# Patient Record
Sex: Male | Born: 1995 | Race: Black or African American | Hispanic: No | Marital: Married | Smoking: Current every day smoker
Health system: Southern US, Community
[De-identification: ages and names within clinical notes are randomized; demographics above are authoritative.]

## PROBLEM LIST (undated history)

## (undated) DIAGNOSIS — F909 Attention-deficit hyperactivity disorder, unspecified type: Secondary | ICD-10-CM

---
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
## 2014-03-11 ENCOUNTER — Encounter (HOSPITAL_COMMUNITY): Payer: Self-pay | Admitting: Emergency Medicine

## 2014-03-11 ENCOUNTER — Emergency Department (HOSPITAL_COMMUNITY)
Admission: EM | Admit: 2014-03-11 | Discharge: 2014-03-11 | Disposition: A | Discharge status: Home / Self Care | Payer: Medicaid Other | Attending: Emergency Medicine | Admitting: Emergency Medicine

## 2014-03-11 DIAGNOSIS — J029 Acute pharyngitis, unspecified: Secondary | ICD-10-CM | POA: Insufficient documentation

## 2014-03-11 DIAGNOSIS — R5383 Other fatigue: Secondary | ICD-10-CM

## 2014-03-11 DIAGNOSIS — R5381 Other malaise: Secondary | ICD-10-CM | POA: Insufficient documentation

## 2014-03-11 DIAGNOSIS — J3489 Other specified disorders of nose and nasal sinuses: Secondary | ICD-10-CM | POA: Insufficient documentation

## 2014-03-11 DIAGNOSIS — D72829 Elevated white blood cell count, unspecified: Secondary | ICD-10-CM | POA: Insufficient documentation

## 2014-03-11 DIAGNOSIS — R112 Nausea with vomiting, unspecified: Secondary | ICD-10-CM | POA: Insufficient documentation

## 2014-03-11 DIAGNOSIS — F17200 Nicotine dependence, unspecified, uncomplicated: Secondary | ICD-10-CM | POA: Insufficient documentation

## 2014-03-11 DIAGNOSIS — M542 Cervicalgia: Secondary | ICD-10-CM | POA: Insufficient documentation

## 2014-03-11 DIAGNOSIS — F988 Other specified behavioral and emotional disorders with onset usually occurring in childhood and adolescence: Secondary | ICD-10-CM | POA: Insufficient documentation

## 2014-03-11 LAB — CBC WITH DIFFERENTIAL/PLATELET
BASOS ABS: 0 10*3/uL (ref 0.0–0.1)
BASOS PCT: 0 % (ref 0–1)
Eosinophils Absolute: 0 10*3/uL (ref 0.0–0.7)
Eosinophils Relative: 0 % (ref 0–5)
HCT: 41.3 % (ref 39.0–52.0)
Hemoglobin: 14.9 g/dL (ref 13.0–17.0)
LYMPHS PCT: 5 % — AB (ref 12–46)
Lymphs Abs: 1 10*3/uL (ref 0.7–4.0)
MCH: 27.1 pg (ref 26.0–34.0)
MCHC: 36.1 g/dL — AB (ref 30.0–36.0)
MCV: 75.1 fL — ABNORMAL LOW (ref 78.0–100.0)
Monocytes Absolute: 2.3 10*3/uL — ABNORMAL HIGH (ref 0.1–1.0)
Monocytes Relative: 11 % (ref 3–12)
NEUTROS ABS: 17.4 10*3/uL — AB (ref 1.7–7.7)
NEUTROS PCT: 84 % — AB (ref 43–77)
PLATELETS: 296 10*3/uL (ref 150–400)
RBC: 5.5 MIL/uL (ref 4.22–5.81)
RDW: 13.6 % (ref 11.5–15.5)
WBC: 20.7 10*3/uL — AB (ref 4.0–10.5)

## 2014-03-11 LAB — MONONUCLEOSIS SCREEN: MONO SCREEN: NEGATIVE

## 2014-03-11 LAB — COMPREHENSIVE METABOLIC PANEL
ALBUMIN: 3.9 g/dL (ref 3.5–5.2)
ALT: 23 U/L (ref 0–53)
AST: 22 U/L (ref 0–37)
Alkaline Phosphatase: 60 U/L (ref 39–117)
BILIRUBIN TOTAL: 0.5 mg/dL (ref 0.3–1.2)
BUN: 11 mg/dL (ref 6–23)
CHLORIDE: 95 meq/L — AB (ref 96–112)
CO2: 25 meq/L (ref 19–32)
Calcium: 9.5 mg/dL (ref 8.4–10.5)
Creatinine, Ser: 1.03 mg/dL (ref 0.50–1.35)
GFR calc Af Amer: >90 mL/min (ref >90)
Glucose, Bld: 107 mg/dL — ABNORMAL HIGH (ref 70–99)
Potassium: 3.7 mEq/L (ref 3.7–5.3)
SODIUM: 134 meq/L — AB (ref 137–147)
Total Protein: 8.2 g/dL (ref 6.0–8.3)

## 2014-03-11 LAB — LIPASE, BLOOD: Lipase: 16 U/L (ref 11–59)

## 2014-03-11 LAB — RAPID STREP SCREEN (MED CTR MEBANE ONLY): STREPTOCOCCUS, GROUP A SCREEN (DIRECT): NEGATIVE

## 2014-03-11 MED ORDER — IBUPROFEN 800 MG PO TABS: 800.0000 mg | ORAL_TABLET | Freq: Three times a day (TID) | ORAL | Status: DC

## 2014-03-11 MED ORDER — KETOROLAC TROMETHAMINE 30 MG/ML IJ SOLN
30.0000 mg | Freq: Once | INTRAMUSCULAR | Status: AC
  Administered 2014-03-11: 30 mg via INTRAVENOUS
  Filled 2014-03-11: qty 1

## 2014-03-11 MED ORDER — PENICILLIN G BENZATHINE 1200000 UNIT/2ML IM SUSP
1.2000 10*6.[IU] | Freq: Once | INTRAMUSCULAR | Status: AC
  Administered 2014-03-11: 1.2 10*6.[IU] via INTRAMUSCULAR
  Filled 2014-03-11: qty 2

## 2014-03-11 MED ORDER — DEXAMETHASONE SODIUM PHOSPHATE 10 MG/ML IJ SOLN
10.0000 mg | Freq: Once | INTRAMUSCULAR | Status: AC
  Administered 2014-03-11: 10 mg via INTRAVENOUS
  Filled 2014-03-11: qty 1

## 2014-03-11 MED ORDER — SODIUM CHLORIDE 0.9 % IV BOLUS (SEPSIS)
1000.0000 mL | Freq: Once | INTRAVENOUS | Status: AC
  Administered 2014-03-11: 1000 mL via INTRAVENOUS

## 2014-03-11 NOTE — ED Notes (Addendum)
Pt reports N/V x3 days. Reports throat is raw and sore because of it. Visitor is speaking mostly for pt because of throat pain. Pt denies diarrhea. Denies abd pain.Reports he hasn't eaten in 3 days.

## 2014-03-11 NOTE — ED Notes (Signed)
Pt now reports he can't turn his neck to the R due to pain.

## 2014-03-11 NOTE — ED Provider Notes (Signed)
CSN: [id number]     Arrival date & time 03/11/14  0724 History   First MD Initiated Contact with Patient 03/11/14 [phone]     Chief Complaint  Patient presents with  . Sore Throat  . Nausea  . Emesis   HPI  Drew Carney is a 18 y.o. male with a PMH of ADHD who presents to the ED for evaluation of a sore throat, nausea, and vomiting. History was provided by the patient. Patient states that he developed cold symptoms 2 days ago. Started with a sore throat, abdominal pain, nasal congestion, fatigue, generalized weakness, nausea and vomiting. He states his abdominal pain, nausea and vomiting has resolved, however, the rest of his symptoms remain. He denies any sick contacts. He has not taken anything to treat his symptoms. Throat pain is constant, worse with swallowing, and described as a throbbing pain. No voice change or difficulty swallowing. Has had decreased appetite and intake due to pain. He also has now developed neck pain with no neck stiffness. Neck pain worse with movement. He denies any fever, headache, cough, SOB, wheezing, diarrhea, constipation, dysuria, or rash.    Past Medical History  Diagnosis Date  . ADHD (attention deficit hyperactivity disorder)    History reviewed. No pertinent past surgical history. No family history on file. History  Substance Use Topics  . Smoking status: Current Every Day Smoker    Types: Cigarettes, Cigars  . Smokeless tobacco: Not on file  . Alcohol Use: No    Review of Systems  Constitutional: Positive for chills, activity change, appetite change and fatigue. Negative for fever.  HENT: Positive for congestion and sore throat. Negative for ear pain, rhinorrhea, trouble swallowing and voice change.   Eyes: Negative for photophobia and visual disturbance.  Respiratory: Negative for cough and shortness of breath.   Cardiovascular: Negative for chest pain and leg swelling.  Gastrointestinal: Negative for nausea (resolved), vomiting (resolved),  abdominal pain (resolved), diarrhea and constipation.  Genitourinary: Negative for dysuria and difficulty urinating.  Musculoskeletal: Positive for neck pain. Negative for back pain and myalgias.  Skin: Negative for rash.  Neurological: Negative for dizziness, weakness, light-headedness, numbness and headaches.    Allergies  Review of patient's allergies indicates no known allergies.  Home Medications   Prior to Admission medications   Not on File   BP 126/64  Pulse 70  Temp(Src) 98 F (36.7 C) (Oral)  Resp 14  SpO2 100%  Filed Vitals:   03/11/14 0741 03/11/14 1039 03/11/14 1215 03/11/14 1216  BP: 126/64 117/48  124/43  Pulse: 70 62 78   Temp: 98 F (36.7 C)     TempSrc: Oral     Resp: 14 16  16   SpO2: 100% 100% 100%     Physical Exam  Nursing note and vitals reviewed. Constitutional: He is oriented to person, place, and time. He appears well-developed and well-nourished. No distress.  Non-toxic  HENT:  Head: Normocephalic and atraumatic.  Right Ear: External ear normal.  Left Ear: External ear normal.  Nose: Nose normal.  Mouth/Throat: Oropharynx is clear and moist. No oropharyngeal exudate.  Tonsils 2+ bilaterally with exudates, edema and erythema. Uvula midline. No trismus. No muffled voice. No difficulty controlling secretions. Tympanic membranes gray and translucent bilaterally with no erythema, edema, or hemotympanum.  No mastoid or tragal tenderness bilaterally.   Eyes: Conjunctivae and EOM are normal. Pupils are equal, round, and reactive to light. Right eye exhibits no discharge. Left eye exhibits no discharge.  Neck:  Normal range of motion. Neck supple.  Bilateral anterior cervical LAD. Lymph nodes <1 cm, firm, tender and mobile. No neck edema. No neck stiffness/nucal rigidity.   Cardiovascular: Normal rate, regular rhythm and normal heart sounds.  Exam reveals no gallop and no friction rub.   No murmur heard. Pulmonary/Chest: Effort normal and breath sounds  normal. No respiratory distress. He has no wheezes. He has no rales. He exhibits no tenderness.  Abdominal: Soft. He exhibits no distension and no mass. There is no tenderness. There is no rebound and no guarding.  Musculoskeletal: Normal range of motion. He exhibits no edema and no tenderness.  Neurological: He is alert and oriented to person, place, and time. No cranial nerve deficit.  Skin: Skin is warm and dry. No rash noted. He is not diaphoretic.     ED Course  Procedures (including critical care time) Labs Review Labs Reviewed  CBC WITH DIFFERENTIAL - Abnormal; Notable for the following:    WBC 20.7 (*)    MCV 75.1 (*)    MCHC 36.1 (*)    Neutrophils Relative % 84 (*)    Neutro Abs 17.4 (*)    Lymphocytes Relative 5 (*)    Monocytes Absolute 2.3 (*)    All other components within normal limits  COMPREHENSIVE METABOLIC PANEL - Abnormal; Notable for the following:    Sodium 134 (*)    Chloride 95 (*)    Glucose, Bld 107 (*)    All other components within normal limits  LIPASE, BLOOD    Imaging Review No results found.   EKG Interpretation None      Results for orders placed during the hospital encounter of 03/11/14  RAPID STREP SCREEN      Result Value Ref Range   Streptococcus, Group A Screen (Direct) NEGATIVE  NEGATIVE  CBC WITH DIFFERENTIAL      Result Value Ref Range   WBC 20.7 (*) 4.0 - 10.5 K/uL   RBC 5.50  4.22 - 5.81 MIL/uL   Hemoglobin 14.9  13.0 - 17.0 g/dL   HCT 16.141.3  09.639.0 - 04.552.0 %   MCV 75.1 (*) 78.0 - 100.0 fL   MCH 27.1  26.0 - 34.0 pg   MCHC 36.1 (*) 30.0 - 36.0 g/dL   RDW 40.913.6  81.111.5 - 91.415.5 %   Platelets 296  150 - 400 K/uL   Neutrophils Relative % 84 (*) 43 - 77 %   Neutro Abs 17.4 (*) 1.7 - 7.7 K/uL   Lymphocytes Relative 5 (*) 12 - 46 %   Lymphs Abs 1.0  0.7 - 4.0 K/uL   Monocytes Relative 11  3 - 12 %   Monocytes Absolute 2.3 (*) 0.1 - 1.0 K/uL   Eosinophils Relative 0  0 - 5 %   Eosinophils Absolute 0.0  0.0 - 0.7 K/uL   Basophils  Relative 0  0 - 1 %   Basophils Absolute 0.0  0.0 - 0.1 K/uL  COMPREHENSIVE METABOLIC PANEL      Result Value Ref Range   Sodium 134 (*) 137 - 147 mEq/L   Potassium 3.7  3.7 - 5.3 mEq/L   Chloride 95 (*) 96 - 112 mEq/L   CO2 25  19 - 32 mEq/L   Glucose, Bld 107 (*) 70 - 99 mg/dL   BUN 11  6 - 23 mg/dL   Creatinine, Ser 7.821.03  0.50 - 1.35 mg/dL   Calcium 9.5  8.4 - 95.610.5 mg/dL   Total Protein 8.2  6.0 - 8.3 g/dL   Albumin 3.9  3.5 - 5.2 g/dL   AST 22  0 - 37 U/L   ALT 23  0 - 53 U/L   Alkaline Phosphatase 60  39 - 117 U/L   Total Bilirubin 0.5  0.3 - 1.2 mg/dL   GFR calc non Af Amer >90  >90 mL/min   GFR calc Af Amer >90  >90 mL/min  LIPASE, BLOOD      Result Value Ref Range   Lipase 16  11 - 59 U/L  MONONUCLEOSIS SCREEN      Result Value Ref Range   Mono Screen NEGATIVE  NEGATIVE    MDM   YIDEL GARBO is a 18 y.o. male with a PMH of ADHD who presents to the ED for evaluation of a sore throat, nausea, and vomiting  Rechecks  12:00 AM = Patient tolerating PO fluids without difficulty. Asking to be discharged. Feels much better. Denies any abdominal pain, neck pain or throat pain.     Etiology of throat pain possibly due to pharyngitis vs early onset mono vs viral syndrome. Patient afebrile and non-toxic in appearance. Rapid strep negative. Monospot negative (however may be too early to determine). No evidence of peritonsillar or retropharyngeal abscess. Doubt cervical/deep tissue abscess. Patient's neck pain resolved throughout his ED visit with IV toradol. No meningeal signs. Patient able to tolerate fluids without difficulty. Patient found to have leukocytosis (20.7). Doubt sepsis. Vital signs stable with no tachycardia, tachypnea or fever. Patient treated with IM penicillin and decadron in the ED. Throat culture pending. Patient also had nausea, vomiting and abdominal pain early on in his illness however had no abdominal pain throughout his ED visit. Abdominal exam benign. Patient  encouraged to drink fluids. Return precautions, discharge instructions, and follow-up was discussed with the patient before discharge.     Discharge Medication List as of 03/11/2014 12:12 PM    START taking these medications   Details  ibuprofen (ADVIL,MOTRIN) 800 MG tablet Take 1 tablet (800 mg total) by mouth 3 (three) times daily., Starting 03/11/2014, Until Discontinued, Print        Final impressions: 1. Pharyngitis   2. Nausea and vomiting   3. Leukocytosis       Greer Ee Tavionna Grout PA-C          Jillyn Ledger, [place] 03/11/14 2230

## 2014-03-11 NOTE — Discharge Instructions (Signed)
Take Ibuprofen to help with pain and swelling  Drink fluids and rest  Return to the emergency department if you develop any changing/worsening condition, difficulty swallowing or breathing, fever, neck stiffness, abdominal pain, repeated vomiting, muffled or changing voice, feeling worse, or any other concerns (please read additional information regarding your condition below)     Pharyngitis Pharyngitis is redness, pain, and swelling (inflammation) of your pharynx.  CAUSES  Pharyngitis is usually caused by infection. Most of the time, these infections are from viruses (viral) and are part of a cold. However, sometimes pharyngitis is caused by bacteria (bacterial). Pharyngitis can also be caused by allergies. Viral pharyngitis may be spread from person to person by coughing, sneezing, and personal items or utensils (cups, forks, spoons, toothbrushes). Bacterial pharyngitis may be spread from person to person by more intimate contact, such as kissing.  SIGNS AND SYMPTOMS  Symptoms of pharyngitis include:   Sore throat.   Tiredness (fatigue).   Low-grade fever.   Headache.  Joint pain and muscle aches.  Skin rashes.  Swollen lymph nodes.  Plaque-like film on throat or tonsils (often seen with bacterial pharyngitis). DIAGNOSIS  Your health care provider will ask you questions about your illness and your symptoms. Your medical history, along with a physical exam, is often all that is needed to diagnose pharyngitis. Sometimes, a rapid strep test is done. Other lab tests may also be done, depending on the suspected cause.  TREATMENT  Viral pharyngitis will usually get better in 3 4 days without the use of medicine. Bacterial pharyngitis is treated with medicines that kill germs (antibiotics).  HOME CARE INSTRUCTIONS   Drink enough water and fluids to keep your urine clear or pale yellow.   Only take over-the-counter or prescription medicines as directed by your health care  provider:   If you are prescribed antibiotics, make sure you finish them even if you start to feel better.   Do not take aspirin.   Get lots of rest.   Gargle with 8 oz of salt water ( tsp of salt per 1 qt of water) as often as every 1 2 hours to soothe your throat.   Throat lozenges (if you are not at risk for choking) or sprays may be used to soothe your throat. SEEK MEDICAL CARE IF:   You have large, tender lumps in your neck.  You have a rash.  You cough up green, yellow-brown, or bloody spit. SEEK IMMEDIATE MEDICAL CARE IF:   Your neck becomes stiff.  You drool or are unable to swallow liquids.  You vomit or are unable to keep medicines or liquids down.  You have severe pain that does not go away with the use of recommended medicines.  You have trouble breathing (not caused by a stuffy nose). MAKE SURE YOU:   Understand these instructions.  Will watch your condition.  Will get help right away if you are not doing well or get worse. Document Released: 09/28/2005 Document Revised: 07/19/2013 Document Reviewed: 06/05/2013 [facility] Patient Information 2014 [place], [place].  Nausea and Vomiting Nausea is a sick feeling that often comes before throwing up (vomiting). Vomiting is a reflex where stomach contents come out of your mouth. Vomiting can cause severe loss of body fluids (dehydration). Children and elderly adults can become dehydrated quickly, especially if they also have diarrhea. Nausea and vomiting are symptoms of a condition or disease. It is important to find the cause of your symptoms. CAUSES  Direct irritation of the stomach lining.  This irritation can result from increased acid production (gastroesophageal reflux disease), infection, food poisoning, taking certain medicines (such as nonsteroidal anti-inflammatory drugs), alcohol use, or tobacco use. Signals from the brain.These signals could be caused by a headache, heat exposure, an inner ear  disturbance, increased pressure in the brain from injury, infection, a tumor, or a concussion, pain, emotional stimulus, or metabolic problems. An obstruction in the gastrointestinal tract (bowel obstruction). Illnesses such as diabetes, hepatitis, gallbladder problems, appendicitis, kidney problems, cancer, sepsis, atypical symptoms of a heart attack, or eating disorders. Medical treatments such as chemotherapy and radiation. Receiving medicine that makes you sleep (general anesthetic) during surgery. DIAGNOSIS Your caregiver may ask for tests to be done if the problems do not improve after a few days. Tests may also be done if symptoms are severe or if the reason for the nausea and vomiting is not clear. Tests may include: Urine tests. Blood tests. Stool tests. Cultures (to look for evidence of infection). X-rays or other imaging studies. Test results can help your caregiver make decisions about treatment or the need for additional tests. TREATMENT You need to stay well hydrated. Drink frequently but in small amounts.You may wish to drink water, sports drinks, clear broth, or eat frozen ice pops or gelatin dessert to help stay hydrated.When you eat, eating slowly may help prevent nausea.There are also some antinausea medicines that may help prevent nausea. HOME CARE INSTRUCTIONS  Take all medicine as directed by your caregiver. If you do not have an appetite, do not force yourself to eat. However, you must continue to drink fluids. If you have an appetite, eat a normal diet unless your caregiver tells you differently. Eat a variety of complex carbohydrates (rice, wheat, potatoes, bread), lean meats, yogurt, fruits, and vegetables. Avoid high-fat foods because they are more difficult to digest. Drink enough water and fluids to keep your urine clear or pale yellow. If you are dehydrated, ask your caregiver for specific rehydration instructions. Signs of dehydration may include: Severe  thirst. Dry lips and mouth. Dizziness. Dark urine. Decreasing urine frequency and amount. Confusion. Rapid breathing or pulse. SEEK IMMEDIATE MEDICAL CARE IF:  You have blood or brown flecks (like coffee grounds) in your vomit. You have black or bloody stools. You have a severe headache or stiff neck. You are confused. You have severe abdominal pain. You have chest pain or trouble breathing. You do not urinate at least once every 8 hours. You develop cold or clammy skin. You continue to vomit for longer than 24 to 48 hours. You have a fever. MAKE SURE YOU:  Understand these instructions. Will watch your condition. Will get help right away if you are not doing well or get worse. Document Released: 09/28/2005 Document Revised: 12/21/2011 Document Reviewed: 02/25/2011 [facility] Patient Information 2014 [place], [place].  Leukocytosis Leukocytosis means you have more white blood cells than normal. White blood cells are made in your bone marrow. The main job of white blood cells is to fight infection. Having too many white blood cells is a common condition. It can develop as a result of many types of medical problems. CAUSES  In some cases, your bone marrow may be normal, but it is still making too many white blood cells. This could be the result of: Infection. Injury. Physical stress. Emotional stress. Surgery. Allergic reactions. Tumors that do not start in the blood or bone marrow. An inherited disease. Certain medicines. Pregnancy and labor. In other cases, you may have a bone marrow disorder that  is causing your body to make too many white blood cells. Bone marrow disorders include: Leukemia. This is a type of blood cancer. Myeloproliferative disorders. These disorders cause blood cells to grow abnormally. SYMPTOMS  Some people have no symptoms. Others have symptoms due to the medical problem that is causing their leukocytosis. These symptoms may  include: Bleeding. Bruising. Fever. Night sweats. Repeated infections. Weakness. Weight loss. DIAGNOSIS  Leukocytosis is often found during blood tests that are done as part of a normal physical exam. Your caregiver will probably order other tests to help determine why you have too many white blood cells. These tests may include: A complete blood count (CBC). This test measures all the types of blood cells in your body. Chest X-rays, urine tests (urinalysis), or other tests to look for signs of infection. Bone marrow aspiration. For this test, a needle is put into your bone. Cells from the bone marrow are removed through the needle. The cells are then examined under a microscope. TREATMENT  Treatment is usually not needed for leukocytosis. However, if a disorder is causing your leukocytosis, it will need to be treated. Treatment may include: Antibiotic medicines if you have a bacterial infection. Bone marrow transplant. Your diseased bone marrow is replaced with healthy cells that will grow new bone marrow. Chemotherapy. This is the use of drugs to kill cancer cells. HOME CARE INSTRUCTIONS Only take over-the-counter or prescription medicines as directed by your caregiver. Maintain a healthy weight. Ask your caregiver what weight is best for you. Eat foods that are low in saturated fats and high in fiber. Eat plenty of fruits and vegetables. Drink enough fluids to keep your urine clear or pale yellow. Get 30 minutes of exercise at least 5 times a week. Check with your caregiver before starting a new exercise routine. Limit caffeine and alcohol. Do not smoke. Keep all follow-up appointments as directed by your caregiver. SEEK MEDICAL CARE IF: You feel weak or more tired than usual. You develop chills, a cough, or nasal congestion. You lose weight without trying. You have night sweats. You bruise easily. SEEK IMMEDIATE MEDICAL CARE IF: You bleed more than normal. You have chest  pain. You have trouble breathing. You have a fever. You have uncontrolled nausea or vomiting. You feel dizzy or lightheaded. MAKE SURE YOU: Understand these instructions. Will watch your condition. Will get help right away if you are not doing well or get worse. Document Released: 09/17/2011 Document Revised: 12/21/2011 Document Reviewed: 09/17/2011 [facility] Patient Information 2014 [place], [place].   Emergency Department Resource Guide 1) Find a Doctor and Pay Out of Pocket Although you won't have to find out who is covered by your insurance plan, it is a good idea to ask around and get recommendations. You will then need to call the office and see if the doctor you have chosen will accept you as a new patient and what types of options they offer for patients who are self-pay. Some doctors offer discounts or will set up payment plans for their patients who do not have insurance, but you will need to ask so you aren't surprised when you get to your appointment.  2) Contact Your Local Health Department Not all health departments have doctors that can see patients for sick visits, but many do, so it is worth a call to see if yours does. If you don't know where your local health department is, you can check in your phone book. The CDC also has a tool to help  you locate your state's health department, and many state websites also have listings of all of their local health departments.  3) Find a Walk-in Clinic If your illness is not likely to be very severe or complicated, you may want to try a walk in clinic. These are popping up all over the country in pharmacies, drugstores, and shopping centers. They're usually staffed by nurse practitioners or physician assistants that have been trained to treat common illnesses and complaints. They're usually fairly quick and inexpensive. However, if you have serious medical issues or chronic medical problems, these are probably not your best option.  No  Primary Care Doctor: - Call Health Connect at  [phone] - they can help you locate a primary care doctor that  accepts your insurance, provides certain services, etc. - Physician Referral Service- [phone]  Chronic Pain Problems: Organization         Address  Phone   Notes  [facility]  [phone] Patients need to be referred by their primary care doctor.   Medication Assistance: Organization         Address  Phone   Notes  [facility] Medication [place] [address] [place] [place], [place] [address] [phone] --Must be a resident of [facility] -- Must have NO insurance coverage whatsoever (no Medicaid/ Medicare, etc.) -- The pt. MUST have a primary care doctor that directs their care regularly and follows them in the community   [facility]ist  [phone]   [employer]  [phone]    Agencies that provide inexpensive medical care: Organization         Address  Phone   Notes  [facility]  [phone]   [facility]    [phone]   [facility] [address] [place], [place] [address] [phone]   [facility] [address] [place] [place] [phone]   Planned Parenthood    [phone]   [facility]    [phone]   [facility]  [address], [place] Phone:  [phone], Fax:  [phone] Hours of Operation:  9 am - 6 pm, M-F.  Also accepts Medicaid/Medicare and self-pay.  [facility]  [address], [place] Phone: [phone], Fax: [phone]. Hours of Operation:  8:30 am - 5:30 pm, M-F.  Also accepts Medicaid and self-pay.  [facility] [place] [address] [place] Phone: [phone]   [facility] [address] Natasha Bence [place], [place] [phone], Ext. [phone] Mondays & Thursdays: 7-9 AM.  First 15 patients are seen on  a first come, first serve basis.    Medicaid-accepting [facility] Providers:  Organization         Address  Phone   Notes  [facility] [address], [place] [phone] Also accepts self-pay patients.  [facility] [address] Laurell Josephs [place], [place]  [phone]   [facility] [address], [place] [phone]   [facility] [address] [place] [phone]   Renaye Rakers [address], [place]   [phone] Only accepts [place] Access [place] patients after they have their name applied to their card.   Self-Pay (no insurance) in [facility]:  Organization  Address  Phone   Notes  Sickle Cell Patients, [facility] [place] [phone]   [facility] [place] [phone]   [facility] [place]  [address], [place] [phone]   [facility]/Dr. Osei-Bonsu  [address], [place] or [address], [place] [phone] Phone number for both [place] and [place] locations is the same.  Urgent Medical and [facility] [address] [place] [phone]   [facility]) [address], [place] or [address] [phone]   [facility] [address] [place] [phone], phone; [phone], fax Sees patients 1st and 3rd Saturday of every month.  Must not qualify for public or private insurance (i.e. Medicaid, Medicare, [place] Health Choice, Veterans' Benefits)  Household income should be no more than 200% of the poverty level The clinic cannot treat you if you are pregnant or think you are pregnant  Sexually transmitted diseases are not treated at the clinic.    Dental Care: Organization          Address  Phone  Notes  [facility] Department of [facility] [place] [phone] Accepts children up to age 57 who are enrolled in [place] or [place]; pregnant women with a Medicaid card; and children who have applied for Medicaid or [place] Health Choice, but were declined, whose parents can pay a reduced fee at time of service.  [facility] Department of [facility]  [address], [place] [phone] Accepts children up to age 67 who are enrolled in [place] or [place]; pregnant women with a Medicaid card; and children who have applied for Medicaid or [place] Health Choice, but were declined, whose parents can pay a reduced fee at time of service.  [facility]  [place] [phone] Patients are seen by appointment only. Walk-ins are not accepted. [place] will see patients 49 years of age and older. Monday - Tuesday (8am-5pm) Most Wednesdays (8:30-5pm) $30 per visit, cash only  [facility] Adult Dental Access PROGRAM  [address], [facility] [phone] Patients are seen by appointment only. Walk-ins are not accepted. [place] will see patients 38 years of age and older. One Wednesday Evening (Monthly: Volunteer Based).  $30 per visit, cash only  [place]  [phone] for adults; Children under age 59, call Graduate Pediatric Dentistry at [phone]. Children aged 77-14, please call [phone] to request a pediatric application.  Dental services are provided in all areas of dental care including fillings, crowns and bridges, complete and partial dentures, implants, gum treatment, root canals, and extractions. Preventive care is also provided. Treatment is provided to both adults and children. Patients are selected via a lottery and there is often a waiting list.   [facility] [address], [place]  [phone] [email or web address]   [facility] [address] [place], [place] [phone], Ext. [phone] Second and Fourth Thursday of each month, opens at 6:30 AM; Clinic ends at 9 AM.  Patients are seen on a first-come first-served basis, and a limited number are seen during each clinic.   [facility]  [address] [place], [place]  [place], [place] [phone]   Eligibility Requirements You must have lived in [place], [place], or [place] counties for at least the last three months.   You cannot be eligible for state or federal sponsored [employer], including [employer], [place], or [employer].   You generally cannot be eligible for healthcare insurance through your employer.    How to apply: Eligibility screenings are held every Tuesday and Wednesday afternoon from 1:00 pm until 4:00 pm. You do not need an appointment for the interview!  [facility] [address], [place], [place]   [place]  [place] Department  [place]  [phone]    Behavioral Health Resources in the Community: Intensive Outpatient Programs Organization         Address  Phone  Notes  [place]. [address], [place], [place] [phone]   [facility] [address] [place], [place]   ADS: Alcohol & Drug Svcs [address] [place], [place]   [place] [address]  [place], [place] or [phone]   Substance Abuse Resources Organization         Address  Phone  Notes  Alcohol and Drug Services  [phone]   [place]  [phone]   [facility] [place]   Chinita Pester  [phone]   Residential & Outpatient Substance Abuse Program  [phone]   Psychological  Services Organization         Address  Phone  Notes  [facility] [place]  [place]  [phone]   [place] [address] [place] or [phone]    Mobile Crisis Teams Organization         Address  Phone  Notes  [facility], Mobile Crisis Care Unit  [phone]   [facility]  [address] [place], [place]   [place] [address], [place] [phone]    Self-Help/Support Groups Organization         Address  Phone             Notes  [place]. of [place] - variety of support groups  [place] Call for more information  Narcotics Anonymous (NA), [facility] [address], [employer] [place]  2 meetings at this location   Special educational needs teacher         Address  Phone  Notes  [facility]idential [facility] [place],    [place]  [phone]   [facility]  [address] [place] 846659, [place], [place]   [place], [place] [phone] Admissions: 8am-3pm M-F  Incentives Substance [place] [address],    [place], [place] [phone]   [facility] [address] Jadene Pierini [place], [place]   [facility] [address],  [place], [place]   [facility] Programs - Intensive Outpatient [address] Kristeen [address], [place], [place]   [facility] (Centre.) [place].,  [place], [place] or [phone]   [facility] (RTS) [address], [place], [place] Accepts Medicaid  [facility] [place] [address],  [place] [phone] Substance Abuse/Addiction Treatment   [facility] Resources Organization  Address  Phone  Notes  [facility]  [phone]   Domenic Schwab, PhD [address] Arlis Porta [place], [place]   [phone] or [phone]   [place], [place] [phone]   [address], [place], [place] [phone] Insurance/Medicaid/sponsorship through [facility] and Families [address] [place]                                    [place], [place] [phone] [place] [address], [place] [phone]    Dr. Adele Schilder  [phone]   [facility] [place] Dept. 1) [address], [place] 2) [place] 3)  [place] [address], [place] [phone]  [phone]   [place] [phone] or [phone] (After Hours)

## 2014-03-13 LAB — CULTURE, GROUP A STREP

## 2014-03-14 NOTE — ED Provider Notes (Signed)
Medical screening examination/treatment/procedure(s) were performed by non-physician practitioner and as supervising physician I was immediately available for consultation/collaboration.   EKG Interpretation None        Gwyneth Sprout, MD 03/14/14 1330

## 2014-04-04 ENCOUNTER — Emergency Department (HOSPITAL_COMMUNITY)
Admission: EM | Admit: 2014-04-04 | Discharge: 2014-04-04 | Disposition: A | Discharge status: Home / Self Care | Payer: Medicaid Other | Attending: Emergency Medicine | Admitting: Emergency Medicine

## 2014-04-04 ENCOUNTER — Encounter (HOSPITAL_COMMUNITY): Payer: Self-pay | Admitting: Emergency Medicine

## 2014-04-04 DIAGNOSIS — F909 Attention-deficit hyperactivity disorder, unspecified type: Secondary | ICD-10-CM | POA: Insufficient documentation

## 2014-04-04 DIAGNOSIS — Z79899 Other long term (current) drug therapy: Secondary | ICD-10-CM | POA: Insufficient documentation

## 2014-04-04 DIAGNOSIS — F17200 Nicotine dependence, unspecified, uncomplicated: Secondary | ICD-10-CM | POA: Insufficient documentation

## 2014-04-04 DIAGNOSIS — J069 Acute upper respiratory infection, unspecified: Secondary | ICD-10-CM

## 2014-04-04 DIAGNOSIS — R51 Headache: Secondary | ICD-10-CM | POA: Insufficient documentation

## 2014-04-04 DIAGNOSIS — R112 Nausea with vomiting, unspecified: Secondary | ICD-10-CM | POA: Insufficient documentation

## 2014-04-04 DIAGNOSIS — R519 Headache, unspecified: Secondary | ICD-10-CM

## 2014-04-04 DIAGNOSIS — H53149 Visual discomfort, unspecified: Secondary | ICD-10-CM | POA: Insufficient documentation

## 2014-04-04 MED ORDER — KETOROLAC TROMETHAMINE 30 MG/ML IJ SOLN
30.0000 mg | Freq: Once | INTRAMUSCULAR | Status: AC
  Administered 2014-04-04: 30 mg via INTRAVENOUS
  Filled 2014-04-04: qty 1

## 2014-04-04 MED ORDER — DIPHENHYDRAMINE HCL 50 MG/ML IJ SOLN
12.5000 mg | Freq: Once | INTRAMUSCULAR | Status: AC
  Administered 2014-04-04: 12.5 mg via INTRAVENOUS
  Filled 2014-04-04: qty 1

## 2014-04-04 MED ORDER — DEXAMETHASONE SODIUM PHOSPHATE 10 MG/ML IJ SOLN
10.0000 mg | Freq: Once | INTRAMUSCULAR | Status: AC
  Administered 2014-04-04: 10 mg via INTRAVENOUS
  Filled 2014-04-04: qty 1

## 2014-04-04 MED ORDER — SODIUM CHLORIDE 0.9 % IV BOLUS (SEPSIS)
500.0000 mL | Freq: Once | INTRAVENOUS | Status: AC
  Administered 2014-04-04: 500 mL via INTRAVENOUS

## 2014-04-04 MED ORDER — METOCLOPRAMIDE HCL 5 MG/ML IJ SOLN
10.0000 mg | Freq: Once | INTRAMUSCULAR | Status: AC
  Administered 2014-04-04: 10 mg via INTRAVENOUS
  Filled 2014-04-04: qty 2

## 2014-04-04 NOTE — ED Provider Notes (Signed)
CSN: [id number]     Arrival date & time 04/04/14  2008 History   First MD Initiated Contact with Patient 04/04/14 2157     Chief Complaint  Patient presents with  . Headache  . Emesis   Patient is a 18 y.o. male presenting with headaches and vomiting. The history is provided by the patient. No language interpreter was used.  Headache Pain location:  L temporal and frontal Quality:  Sharp Radiates to:  Face Severity currently:  8/10 Severity at highest:  9/10 Onset quality:  Gradual Duration:  2 days Timing:  Constant Progression:  Worsening Chronicity:  New Similar to prior headaches: yes   Context: bright light and loud noise   Context: not activity, not caffeine, not coughing, not defecating, not eating, not stress, not exposure to cold air, not intercourse and not straining   Relieved by:  Nothing Worsened by:  Light and sound Ineffective treatments: allergy medication. Associated symptoms: congestion, cough, eye pain, nausea, photophobia, URI and vomiting   Associated symptoms: no abdominal pain, no back pain, no blurred vision, no diarrhea, no dizziness, no drainage, no ear pain, no facial pain, no fatigue, no focal weakness, no hearing loss, no loss of balance, no myalgias, no near-syncope, no neck pain, no neck stiffness, no numbness, no paresthesias, no seizures, no sinus pressure, no sore throat, no swollen glands, no syncope, no tingling, no visual change and no weakness   Congestion:    Location:  Nasal   Interferes with sleep: no     Interferes with eating/drinking: no   Cough:    Cough characteristics:  Non-productive   Sputum characteristics:  Green   Severity:  Mild   Onset quality:  Gradual   Duration:  1 week   Timing:  Intermittent   Progression:  Improving   Chronicity:  New Nausea:    Severity:  Moderate   Onset quality:  Gradual   Duration:  1 day   Timing:  Intermittent   Progression:  Unchanged Vomiting:    Quality:  Stomach contents   Number of  occurrences:  1   Severity:  Moderate   Duration:  1 day   Timing:  Intermittent   Progression:  Unchanged Risk factors: no family hx of SAH   Emesis Associated symptoms: headaches and URI   Associated symptoms: no abdominal pain, no diarrhea, no myalgias and no sore throat     Past Medical History  Diagnosis Date  . ADHD (attention deficit hyperactivity disorder)    History reviewed. No pertinent past surgical history. No family history on file. History  Substance Use Topics  . Smoking status: Current Every Day Smoker    Types: Cigarettes, Cigars  . Smokeless tobacco: Not on file  . Alcohol Use: No    Review of Systems  Constitutional: Negative for fatigue.  HENT: Positive for congestion. Negative for ear pain, hearing loss, postnasal drip, sinus pressure and sore throat.   Eyes: Positive for photophobia and pain. Negative for blurred vision.  Respiratory: Positive for cough.   Cardiovascular: Negative for syncope and near-syncope.  Gastrointestinal: Positive for nausea and vomiting. Negative for abdominal pain and diarrhea.  Musculoskeletal: Negative for back pain, myalgias, neck pain and neck stiffness.  Neurological: Positive for headaches. Negative for dizziness, focal weakness, seizures, numbness, paresthesias and loss of balance.      Allergies  Review of patient's allergies indicates no known allergies.  Home Medications   Prior to Admission medications   Medication Sig Start Date  End Date Taking? Authorizing Provider  ibuprofen (ADVIL,MOTRIN) 200 MG tablet Take 200 mg by mouth every 4 (four) hours as needed for moderate pain.   Yes Historical Provider, MD  loratadine (CLARITIN) 10 MG tablet Take 10 mg by mouth daily.   Yes Historical Provider, MD  ibuprofen (ADVIL,MOTRIN) 800 MG tablet Take 1 tablet (800 mg total) by mouth 3 (three) times daily. 03/11/14   Janene HarveyJessica K Palmer, PA-C   BP 128/66  Pulse 85  Temp(Src) 97.9 F (36.6 C) (Oral)  Resp 16  SpO2  99% Physical Exam  Nursing note and vitals reviewed. Constitutional: He is oriented to person, place, and time. He appears well-developed and well-nourished. No distress.  HENT:  Head: Normocephalic and atraumatic.  Nose: Mucosal edema and rhinorrhea present. Right sinus exhibits no maxillary sinus tenderness and no frontal sinus tenderness. Left sinus exhibits no maxillary sinus tenderness and no frontal sinus tenderness.  Mouth/Throat: Oropharynx is clear and moist. No oropharyngeal exudate.  Eyes: Conjunctivae and EOM are normal. Pupils are equal, round, and reactive to light. No scleral icterus.  Left tonopen measurement of 20 for occular pressure.  Neck: Normal range of motion. Neck supple. No JVD present. No thyromegaly present.  Cardiovascular: Normal rate, regular rhythm, normal heart sounds and intact distal pulses.  Exam reveals no gallop and no friction rub.   No murmur heard. Pulmonary/Chest: Effort normal and breath sounds normal. No respiratory distress. He has no wheezes. He has no rales. He exhibits no tenderness.  Abdominal: Soft. Bowel sounds are normal. He exhibits no distension and no mass. There is no tenderness. There is no rebound and no guarding.  Musculoskeletal: Normal range of motion.  Lymphadenopathy:    He has no cervical adenopathy.  Neurological: He is alert and oriented to person, place, and time. No cranial nerve deficit. Coordination normal.  Skin: Skin is warm and dry. He is not diaphoretic.  Psychiatric: He has a normal mood and affect. His behavior is normal. Judgment and thought content normal.    ED Course  Procedures (including critical care time) Labs Review Labs Reviewed - No data to display  Imaging Review No results found.   EKG Interpretation None      MDM   Final diagnoses:  Acute nonintractable headache, unspecified headache type  Viral URI   Patient presents with 2 day history of headache with left eye pain, and 1 week history  of cough and congestion.  Patient states that he has had no visual changes.  Tonometry performed here in the ED with measurement of 20 of the left occular pressure.  Patient is afebrile here and has negative kernig and brudzinski.  Lungs are clear to auscultation here in the ED.  Suspect that the patient has a viral URI at this time.  Will treat conservatively with saline solution in his nose and also tylenol prn for myalgias.  I have treated the patient with a headache cocktail and NS bolus here.  Patient states that he feels considerably better and would like to go home.  Patient was given strict return precautions of sudden onset of worst headache of his life, visual changes, and headache associated with fever and neck stiffness.  Patient is to follow-up with a PCP of his choosing from the resource list.      Clydie Braunourtney A Forucci, PA-C 04/04/14 2325

## 2014-04-04 NOTE — Discharge Instructions (Signed)
Upper Respiratory Infection, Adult An upper respiratory infection (URI) is also known as the common cold. It is often caused by a type of germ (virus). Colds are easily spread (contagious). You can pass it to others by kissing, coughing, sneezing, or drinking out of the same glass. Usually, you get better in 1 or 2 weeks.  HOME CARE   Only take medicine as told by your doctor.  Use a warm mist humidifier or breathe in steam from a hot shower.  Drink enough water and fluids to keep your pee (urine) clear or pale yellow.  Get plenty of rest.  Return to work when your temperature is back to normal or as told by your doctor. You may use a face mask and wash your hands to stop your cold from spreading. GET HELP RIGHT AWAY IF:   After the first few days, you feel you are getting worse.  You have questions about your medicine.  You have chills, shortness of breath, or brown or red spit (mucus).  You have yellow or brown snot (nasal discharge) or pain in the face, especially when you bend forward.  You have a fever, puffy (swollen) neck, pain when you swallow, or white spots in the back of your throat.  You have a bad headache, ear pain, sinus pain, or chest pain.  You have a high-pitched whistling sound when you breathe in and out (wheezing).  You have a lasting cough or cough up blood.  You have sore muscles or a stiff neck. MAKE SURE YOU:   Understand these instructions.  Will watch your condition.  Will get help right away if you are not doing well or get worse. Document Released: 03/16/2008 Document Revised: 12/21/2011 Document Reviewed: 02/02/2011 Exit[employer] Patient Information 2015 [employer]. This information is not intended to replace advice given to you by your health care provider. Make sure you discuss any questions you have with your health care provider.  

## 2014-04-04 NOTE — ED Notes (Addendum)
Pt reports having a anterior, left sided headache for the past two days. Pt reports taking an over-the-counter allergy medication prior to arrival, which has not helped his symptoms. Pt reports having nausea and two episodes of emesis. Pt denies a history of migraines. Pt is A/O x4, in NAD, and vitals are WDL.

## 2014-04-05 NOTE — ED Provider Notes (Signed)
Medical screening examination/treatment/procedure(s) were performed by non-physician practitioner and as supervising physician I was immediately available for consultation/collaboration.   EKG Interpretation None        Audree CamelScott T Paiden Cavell, MD 04/05/14 1523

## 2020-04-04 ENCOUNTER — Encounter (HOSPITAL_COMMUNITY): Payer: Self-pay | Admitting: Emergency Medicine

## 2020-04-04 ENCOUNTER — Other Ambulatory Visit: Payer: Self-pay

## 2020-04-04 ENCOUNTER — Ambulatory Visit (HOSPITAL_COMMUNITY)
Admission: EM | Admit: 2020-04-04 | Discharge: 2020-04-04 | Disposition: A | Discharge status: Home / Self Care | Payer: Medicaid Other | Attending: Internal Medicine | Admitting: Internal Medicine

## 2020-04-04 ENCOUNTER — Ambulatory Visit (INDEPENDENT_AMBULATORY_CARE_PROVIDER_SITE_OTHER): Payer: Medicaid Other

## 2020-04-04 DIAGNOSIS — M79672 Pain in left foot: Secondary | ICD-10-CM

## 2020-04-04 DIAGNOSIS — S92215A Nondisplaced fracture of cuboid bone of left foot, initial encounter for closed fracture: Secondary | ICD-10-CM

## 2020-04-04 MED ORDER — IBUPROFEN 800 MG PO TABS
800.0000 mg | ORAL_TABLET | Freq: Three times a day (TID) | ORAL | 0 refills | Status: DC
Start: 2020-04-04 — End: 2021-02-19

## 2020-04-04 MED ORDER — HYDROCODONE-ACETAMINOPHEN 5-325 MG PO TABS: 1.0000 | ORAL_TABLET | Freq: Four times a day (QID) | ORAL | 0 refills | Status: DC | PRN

## 2020-04-04 NOTE — Progress Notes (Signed)
Orthopedic Tech Progress Note Patient Details:  Drew Carney 1995/10/21 [id number]  Ortho Devices Type of Ortho Device: Post (short leg) splint Ortho Device/Splint Location: LLE Ortho Device/Splint Interventions: Application, Ordered   Post Interventions Patient Tolerated: Well Instructions Provided: Care of device   [name] A Stevens Magwood 04/04/2020, 8:30 PM

## 2020-04-04 NOTE — Discharge Instructions (Signed)
Wear the splint at all times Do not apply weight to the foot and use crutches  Elevate and ice tonight  Take medicines as prescribed  Call the orthopedics office tomorrow  If you feel your toes or foot is become any more numb or have severely worsening pain, return or go to the ED

## 2020-04-04 NOTE — ED Provider Notes (Signed)
[facility]    CSN: [id number] Arrival date & time: 04/04/20  1819      History   Chief Complaint Chief Complaint  Patient presents with  . Fall  . Foot Pain    HPI Drew Carney is a 24 y.o. male.   Patient reports for evaluation of left foot and ankle pain.  He reports he stepped off the steps today and rolled his ankle."  I feel like my ankle went the wrong way".  He reports he tried to walk after that was able to bear some weight however it hurt a lot.  He reports most the pain is in his foot.  He does report some numbness and tingling in the foot.  His ankle hurts a little bit but the foot is the biggest concern.  He points to an area of swelling on the left lateral foot.  He has sprained this ankle before but never broken any bones in it.     Past Medical History:  Diagnosis Date  . ADHD (attention deficit hyperactivity disorder)     There are no problems to display for this patient.   History reviewed. No pertinent surgical history.     Home Medications    Prior to Admission medications   Medication Sig Start Date End Date Taking? Authorizing Provider  HYDROcodone-acetaminophen (NORCO/VICODIN) 5-325 MG tablet Take 1-2 tablets by mouth every 6 (six) hours as needed for severe pain. 04/04/20   Glenford Garis, Marguerita Beards, PA-C  ibuprofen (ADVIL) 800 MG tablet Take 1 tablet (800 mg total) by mouth 3 (three) times daily. 04/04/20   Bayley Yarborough, Marguerita Beards, PA-C  loratadine (CLARITIN) 10 MG tablet Take 10 mg by mouth daily.    [provider]    Family History Family History  Problem Relation Age of Onset  . Cancer Father     Social History Social History   Tobacco Use  . Smoking status: Current Every Day Smoker    Types: Cigarettes, Cigars  Substance Use Topics  . Alcohol use: No  . Drug use: Yes    Types: Marijuana    Comment: "every other day"     Allergies   Patient has no known allergies.   Review of Systems Review of Systems   Physical  Exam Triage Vital Signs ED Triage Vitals  Enc Vitals Group     BP 04/04/20 1910 126/87     Pulse Rate 04/04/20 1910 65     Resp 04/04/20 1910 16     Temp 04/04/20 1910 98.2 F (36.8 C)     Temp Source 04/04/20 1910 Oral     SpO2 04/04/20 1910 100 %     Weight --      Height --      Head Circumference --      Peak Flow --      Pain Score 04/04/20 1907 10     Pain Loc --      Pain Edu? --      Excl. in [place]? --    No data found.  Updated Vital Signs BP 126/87 (BP Location: Right Arm)   Pulse 65   Temp 98.2 F (36.8 C) (Oral)   Resp 16   SpO2 100%   Visual Acuity Right Eye Distance:   Left Eye Distance:   Bilateral Distance:    Right Eye Near:   Left Eye Near:    Bilateral Near:     Physical Exam Vitals and nursing note reviewed.  Constitutional:      Appearance: Normal appearance. He is well-developed.  HENT:     Head: Normocephalic and atraumatic.  Eyes:     Conjunctiva/sclera: Conjunctivae normal.  Cardiovascular:     Rate and Rhythm: Normal rate.  Pulmonary:     Effort: Pulmonary effort is normal. No respiratory distress.  Musculoskeletal:     Right foot: Normal.     Left foot: Swelling and tenderness present.     Comments: Left lower extremity : Patient unable to bear weight.  There is swelling and tenderness over the midfoot and tenderness over the proximal fifth metacarpal.  There is some posterior tenderness in the lateral malleoli  Sensation intact but reported to be decreased.  Cap refill less than 2 seconds.  Patient reports he cant move his toes due to pain.  Ankle joint stable, no deformity.    Skin:    General: Skin is warm and dry.  Neurological:     Mental Status: He is alert.      UC Treatments / Results  Labs (all labs ordered are listed, but only abnormal results are displayed) Labs Reviewed - No data to display  EKG   Radiology DG Foot Complete Left  Result Date: 04/04/2020 CLINICAL DATA:  Left foot pain after fall,  rolled foot. Stepped down on the stairs wrong. EXAM: LEFT FOOT - COMPLETE 3+ VIEW COMPARISON:  None. FINDINGS: Possible tiny nondisplaced fracture involving the anterolateral aspect of the cuboid, seen only on the AP view. No other fracture. Normal alignment and joint spaces. There is soft tissue edema. IMPRESSION: Possible tiny nondisplaced fracture involving the anterolateral aspect of the cuboid. Electronically Signed   By: Narda Rutherford M.D.   On: 04/04/2020 19:41    Procedures Procedures (including critical care time)  Medications Ordered in UC Medications - No data to display  Initial Impression / Assessment and Plan / UC Course  I have reviewed the triage vital signs and the nursing notes.  Pertinent labs & imaging results that were available during my care of the patient were reviewed by me and considered in my medical decision making (see chart for details).     #Possible Cuboid Fracture Patient is a 24 year old male presenting with possible cuboid fracture.  Placed in posterior splint and made nonweightbearing.  Will follow up with orthopedics.  Short course of narcotics for pain management with ibuprofen.  Discussed ice and elevation.  Discussed emergency department and return precautions.  Patient verbalized understanding plan. Final Clinical Impressions(s) / UC Diagnoses   Final diagnoses:  Closed nondisplaced fracture of cuboid of left foot, initial encounter     Discharge Instructions     Wear the splint at all times Do not apply weight to the foot and use crutches  Elevate and ice tonight  Take medicines as prescribed  Call the orthopedics office tomorrow  If you feel your toes or foot is become any more numb or have severely worsening pain, return or go to the ED      ED Prescriptions    Medication Sig Dispense Auth. Provider   HYDROcodone-acetaminophen (NORCO/VICODIN) 5-325 MG tablet Take 1-2 tablets by mouth every 6 (six) hours as needed for severe  pain. 6 tablet Jedi Catalfamo, Veryl Speak, PA-C   ibuprofen (ADVIL) 800 MG tablet Take 1 tablet (800 mg total) by mouth 3 (three) times daily. 21 tablet Leiann Sporer, Veryl Speak, PA-C     I have reviewed the PDMP during this encounter.   Colinda Barth, Veryl Speak, PA-C  04/04/20 2032  

## 2020-04-04 NOTE — ED Triage Notes (Signed)
Patient fell on steps today.  Patient has pain and swelling to left foot.

## 2020-04-10 ENCOUNTER — Encounter: Payer: Self-pay | Admitting: Orthopaedic Surgery

## 2020-04-10 ENCOUNTER — Ambulatory Visit (INDEPENDENT_AMBULATORY_CARE_PROVIDER_SITE_OTHER): Payer: Medicaid Other | Admitting: Orthopaedic Surgery

## 2020-04-10 DIAGNOSIS — S92212A Displaced fracture of cuboid bone of left foot, initial encounter for closed fracture: Secondary | ICD-10-CM

## 2020-04-10 NOTE — Progress Notes (Signed)
   Office Visit Note   Patient: Drew Carney           Date of Birth: [birth year]           MRN: [id number] Visit Date: 04/10/2020              Requested by: No referring provider defined for this encounter. PCP: Patient, No Pcp Per   Assessment & Plan: Visit Diagnoses:  1. Displaced fracture of cuboid bone of left foot, initial encounter for closed fracture     Plan: Impression is avulsion fracture of the left cuboid.  He may weight-bear as tolerated in cam boot and crutches as needed.  I have instructed him to take Advil 3 times a day and elevate and ice to help with inflammation and swelling.  Follow-up in 4 weeks for recheck.  Follow-Up Instructions: Return in about 4 weeks (around 05/08/2020).   Orders:  No orders of the defined types were placed in this encounter.  No orders of the defined types were placed in this encounter.     Procedures: No procedures performed   Clinical Data: No additional findings.   Subjective: Chief Complaint  Patient presents with  . Left Foot - Fracture    Jeannett Senior is a ER follow-up from yesterday.  He had a misstep off of some stairs and rolled his ankle.  He was placed in a short leg splint.  He was diagnosed with avulsion fracture of the cuboid.  He has lateral sided midfoot heel pain is worse at night.   Review of Systems  Constitutional: Negative.   All other systems reviewed and are negative.    Objective: Vital Signs: There were no vitals taken for this visit.  Physical Exam Vitals and nursing note reviewed.  Constitutional:      Appearance: He is well-developed.  HENT:     Head: Normocephalic and atraumatic.  Eyes:     Pupils: Pupils are equal, round, and reactive to light.  Pulmonary:     Effort: Pulmonary effort is normal.  Abdominal:     Palpations: Abdomen is soft.  Musculoskeletal:        General: Normal range of motion.     Cervical back: Neck supple.  Skin:    General: Skin is warm.  Neurological:      Mental Status: He is alert and oriented to person, place, and time.  Psychiatric:        Behavior: Behavior normal.        Thought Content: Thought content normal.        Judgment: Judgment normal.     Ortho Exam Left foot shows a mild swelling and moderate bruising.  No neurovascular compromise. Specialty Comments:  No specialty comments available.  Imaging: No results found.   PMFS History: There are no problems to display for this patient.  Past Medical History:  Diagnosis Date  . ADHD (attention deficit hyperactivity disorder)     Family History  Problem Relation Age of Onset  . Cancer Father     History reviewed. No pertinent surgical history. Social History   Occupational History  . Not on file  Tobacco Use  . Smoking status: Current Every Day Smoker    Types: Cigarettes, Cigars  Substance and Sexual Activity  . Alcohol use: No  . Drug use: Yes    Types: Marijuana    Comment: "every other day"  . Sexual activity: Not on file

## 2020-05-08 ENCOUNTER — Ambulatory Visit: Payer: Medicaid Other | Admitting: Orthopaedic Surgery

## 2020-05-17 ENCOUNTER — Ambulatory Visit: Payer: Medicaid Other | Admitting: Orthopaedic Surgery

## 2021-02-19 ENCOUNTER — Encounter (HOSPITAL_COMMUNITY): Payer: Self-pay

## 2021-02-19 ENCOUNTER — Emergency Department (HOSPITAL_COMMUNITY)
Admission: EM | Admit: 2021-02-19 | Discharge: 2021-02-19 | Disposition: A | Discharge status: Home / Self Care | Payer: Medicaid Other | Attending: Emergency Medicine | Admitting: Emergency Medicine

## 2021-02-19 ENCOUNTER — Emergency Department (HOSPITAL_COMMUNITY): Payer: Medicaid Other

## 2021-02-19 DIAGNOSIS — F17210 Nicotine dependence, cigarettes, uncomplicated: Secondary | ICD-10-CM | POA: Insufficient documentation

## 2021-02-19 DIAGNOSIS — M25512 Pain in left shoulder: Secondary | ICD-10-CM | POA: Diagnosis present

## 2021-02-19 MED ORDER — NAPROXEN 500 MG PO TABS
500.0000 mg | ORAL_TABLET | Freq: Two times a day (BID) | ORAL | 0 refills | Status: DC
Start: 2021-02-19 — End: 2021-05-30

## 2021-02-19 NOTE — ED Provider Notes (Signed)
  Emergency Medicine Provider in Triage Note   MSE was initiated and I personally evaluated the patient  10:41 PM on Feb 19, 2021 as provider in triage.   Chief Complaint: left shoulder pain  HPI  Patient is a 25 y.o. who presets to the ED with complaints of L shoulder pain since yesterday, worse with movement, no direct trauma/injury, was lifting some plastic yesterday, is left hand dominant. Has had issues with this shoulder in the past- no prior surgeries.    Review of Systems  Positive: Left shoulder pain Negative: Numbness, weakness  Physical Exam  BP 130/75 (BP Location: Right Arm)   Pulse 86   Temp 97.8 F (36.6 C) (Oral)   Resp 16   SpO2 98%    Gen:   Awake, no distress   HEENT:  Atraumatic  Resp:  Normal effort  Cardiac:  Normal rate, 2+ symmetric radial pulses.  Abd:   Nondistended, nontender  MSK:   LUE: L shoulder flexion/abduction limited actively, I am able to passively range fully. Tender to the left shoulder, more so to posterior mucle groups.  Neuro:  Speech clear, sensation grossly intact to UEs. 5/5 symmetric grip strength. Able to perform OK , thumbs up, and cross 2nd/3rd digits bilaterally.   Medical Decision Making   Initiation of care has begun. The patient has been counseled on the process, plan, and necessity for staying for the completion/evaluation, informed that the remainder of the evaluation will be completed by another provider, this initial triage assessment does not replace that evaluation, and the importance of remaining in the ED until their evaluation is complete.    Clinical Impression  Shoulder pain.         Desmond Lope 02/19/21 2243    Eber Hong, MD 02/19/21 [phone]

## 2021-02-19 NOTE — Discharge Instructions (Signed)
Take naproxen twice a day as needed for pain, topical diclofenac gel may also be helpful.  Call the office listed above, be seen by the next available appointment  Your x-rays are normal looking
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
## 2021-02-19 NOTE — ED Triage Notes (Signed)
Pt reports that his L shoulder has been hurting since yesterday, denies injury or heavy lifting

## 2021-05-30 ENCOUNTER — Emergency Department (HOSPITAL_COMMUNITY)
Admission: EM | Admit: 2021-05-30 | Discharge: 2021-05-31 | Disposition: A | Discharge status: Home / Self Care | Payer: Medicaid Other | Attending: Emergency Medicine | Admitting: Emergency Medicine

## 2021-05-30 ENCOUNTER — Other Ambulatory Visit: Payer: Self-pay

## 2021-05-30 DIAGNOSIS — F17210 Nicotine dependence, cigarettes, uncomplicated: Secondary | ICD-10-CM | POA: Insufficient documentation

## 2021-05-30 DIAGNOSIS — X58XXXA Exposure to other specified factors, initial encounter: Secondary | ICD-10-CM | POA: Insufficient documentation

## 2021-05-30 DIAGNOSIS — Y990 Civilian activity done for income or pay: Secondary | ICD-10-CM | POA: Insufficient documentation

## 2021-05-30 DIAGNOSIS — T1592XA Foreign body on external eye, part unspecified, left eye, initial encounter: Secondary | ICD-10-CM

## 2021-05-30 MED ORDER — FLUORESCEIN SODIUM 1 MG OP STRP
1.0000 | ORAL_STRIP | Freq: Once | OPHTHALMIC | Status: AC
  Administered 2021-05-31: 1 via OPHTHALMIC
  Filled 2021-05-30: qty 1

## 2021-05-30 MED ORDER — ERYTHROMYCIN 5 MG/GM OP OINT: TOPICAL_OINTMENT | OPHTHALMIC | 0 refills | Status: AC

## 2021-05-30 MED ORDER — ERYTHROMYCIN 5 MG/GM OP OINT
TOPICAL_OINTMENT | Freq: Once | OPHTHALMIC | Status: AC
  Filled 2021-05-30: qty 3.5

## 2021-05-30 MED ORDER — TETRACAINE HCL 0.5 % OP SOLN
2.0000 [drp] | Freq: Once | OPHTHALMIC | Status: AC
  Administered 2021-05-30: 2 [drp] via OPHTHALMIC
  Filled 2021-05-30: qty 4

## 2021-05-30 NOTE — ED Triage Notes (Signed)
Patient presents after getting something in his eye at work. 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

## 2021-05-30 NOTE — Discharge Instructions (Addendum)
You may have a small foreign body or rust ring in your left eye.  We were not able to get this out in the ER.  I spoke to the ophthalmologist Dr. Wynell Balloon who will see you in the office tomorrow (Saturday, 8/20) at 9:15 AM sharp.  Please give yourself time to arrive to the office early.  This is at [facility] at [address], [place], [place] [address].    In the meantime, we put antibiotic ointment over your eye to protect it tonight.  Please do NOT rub your eye.  This would only cause further injury.  You can put an ice pack near the eye for 10 minutes at a time as needed if this helps relieve some pain. You can also take ibuprofen at home.  You should not use the numbing drops for more than the next 12 hours.   You should discontinue these drops if the eye doctor tells you to tomorrow.  I sent a prescription to your pharmacy for erythromycin ointment that you will need to use for a few more days, beginning tomorrow morning.

## 2021-05-30 NOTE — ED Provider Notes (Signed)
[facility]-EMERGENCY DEPT Provider Note   CSN: [id number] Arrival date & time: 05/30/21  2120     History Chief Complaint  Patient presents with   Foreign Body in Eye    Drew Carney is a 25 y.o. male present emerged part foreign body in his left eye.  The patient ports he was at work this morning and something fell from the rafters into his left eye.  It has been very irritated all day.  He does not wear contacts.  He denies injury to the other eye.  He reports his vision has been blurry.  HPI     Past Medical History:  Diagnosis Date   ADHD (attention deficit hyperactivity disorder)     There are no problems to display for this patient.   No past surgical history on file.     Family History  Problem Relation Age of Onset   Cancer Father     Social History   Tobacco Use   Smoking status: Every Day    Types: Cigarettes, Cigars  Substance Use Topics   Alcohol use: No   Drug use: Yes    Types: Marijuana    Comment: "every other day"    Home Medications Prior to Admission medications   Medication Sig Start Date End Date Taking? Authorizing Provider  erythromycin ophthalmic ointment Place a 1/2 inch ribbon of ointment into the lower eyelid of your left eye, 4 times per day, for 5 days 05/30/21  Yes Raun Routh, Kermit Balo, MD    Allergies    Patient has no known allergies.  Review of Systems   Review of Systems  Constitutional:  Negative for chills and fever.  Eyes:  Positive for photophobia, pain, redness and visual disturbance. Negative for discharge.   Physical Exam Updated Vital Signs BP 124/78   Pulse 66   Temp 97.6 F (36.4 C) (Oral)   Resp 18   SpO2 94%   Physical Exam Constitutional:      General: He is not in acute distress. HENT:     Head: Normocephalic and atraumatic.  Eyes:     General: Lids are normal. Lids are everted, no foreign bodies appreciated. Vision grossly intact. Gaze aligned appropriately.        Right  eye: No foreign body or discharge.        Left eye: No discharge.     Extraocular Movements:     Right eye: Normal extraocular motion.     Left eye: Normal extraocular motion.     Conjunctiva/sclera: Conjunctivae normal.     Right eye: Right conjunctiva is not injected. No chemosis, exudate or hemorrhage.    Left eye: Left conjunctiva is not injected. No chemosis, exudate or hemorrhage.    Pupils: Pupils are equal, round, and reactive to light.      Comments: Small foreign body in mid-pupil left eye Flouresceine stain with no visible abrasion, noted small punctate mid-pupil lesion, negative seidel test   Cardiovascular:     Rate and Rhythm: Normal rate and regular rhythm.  Skin:    General: Skin is warm and dry.  Neurological:     General: No focal deficit present.     Mental Status: He is alert. Mental status is at baseline.    ED Results / Procedures / Treatments   Labs (all labs ordered are listed, but only abnormal results are displayed) Labs Reviewed - No data to display  EKG None  Radiology No results found.  Procedures Procedures   Medications Ordered in ED Medications  fluorescein ophthalmic strip 1 strip (has no administration in time range)  tetracaine (PONTOCAINE) 0.5 % ophthalmic solution 2 drop (has no administration in time range)  erythromycin ophthalmic ointment (has no administration in time range)    ED Course  I have reviewed the triage vital signs and the nursing notes.  Pertinent labs & imaging results that were available during my care of the patient were reviewed by me and considered in my medical decision making (see chart for details).  Patient with possible small foreign body versus rust ring in the left mid pupil.  Vision is grossly intact.  No evidence of ocular perforation, normal Seidel test on exam.  No other obvious lacerations.  I did attempt to remove the foreign body after tetracaine anesthesia, but was unable to do so.  He maintains  a same small punctate lesion.  I am unsure if this may be a rust ring.  I spoke to Dr Wynell Balloon from ophthalmology who advised that he can see the patient in his office tomorrow morning at 9:15 AM.  I think is reasonable to treat the patient with erythromycin ointment, cover and protect his eye, strongly advised him (and his wife at bedside) that he needs to avoid rubbing or touching his eye.  He can follow-up in the morning.  They verbalized understanding.  He does not wear contacts.     Final Clinical Impression(s) / ED Diagnoses Final diagnoses:  Foreign body of left eye, initial encounter    Rx / DC Orders ED Discharge Orders          Ordered    erythromycin ophthalmic ointment        05/30/21 2356             Terald Sleeper, MD 05/31/21 1212

## 2021-06-21 IMAGING — DX DG FOOT COMPLETE 3+V*L*
3 series · 3 of 3 positions shown · non-contrast
Comparison: None.

CLINICAL DATA: Left foot pain after fall, rolled foot. Stepped down
on the stairs wrong.

EXAM:
LEFT FOOT - COMPLETE 3+ VIEW

[foot ap]
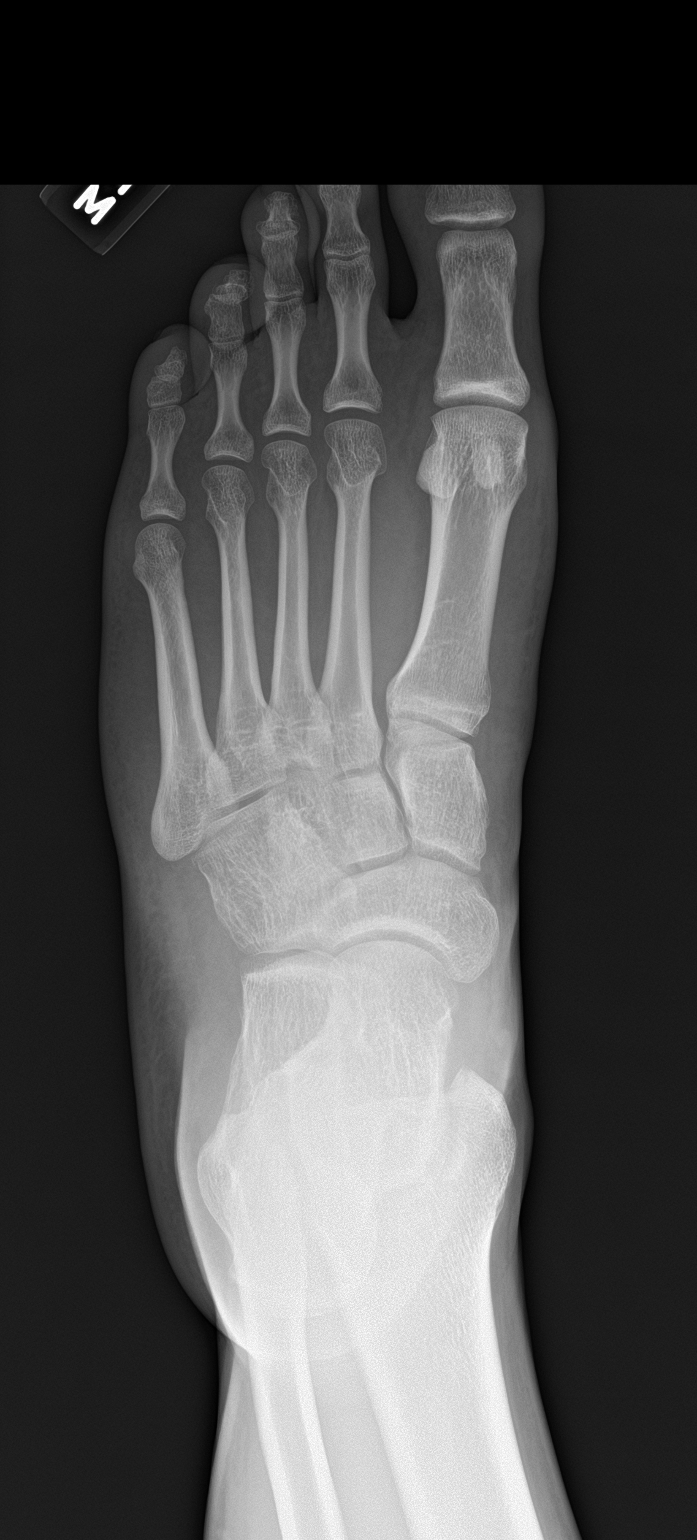

[foot obl]
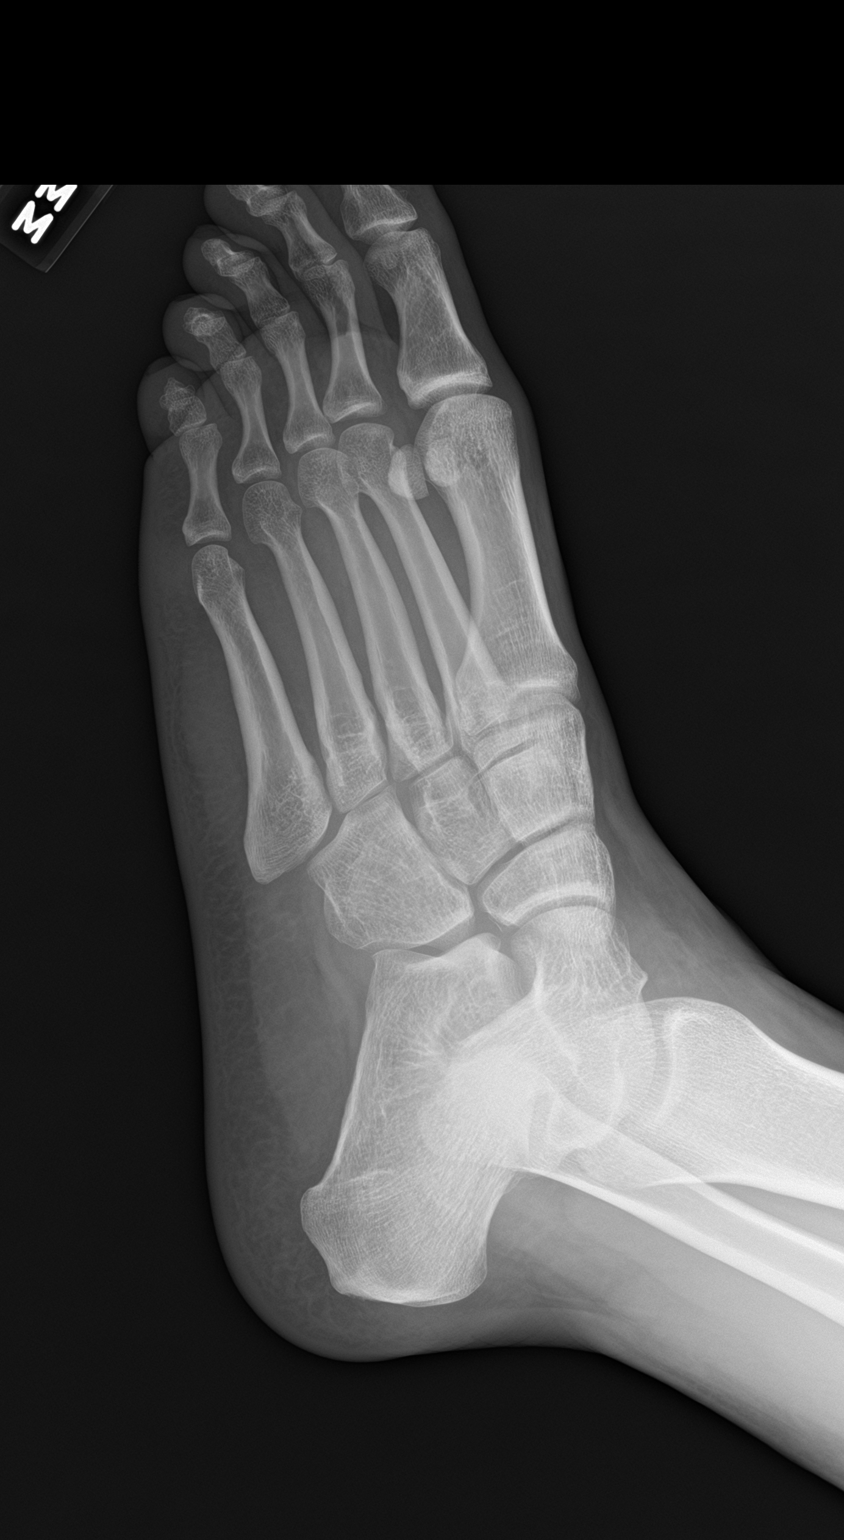

[foot lat]
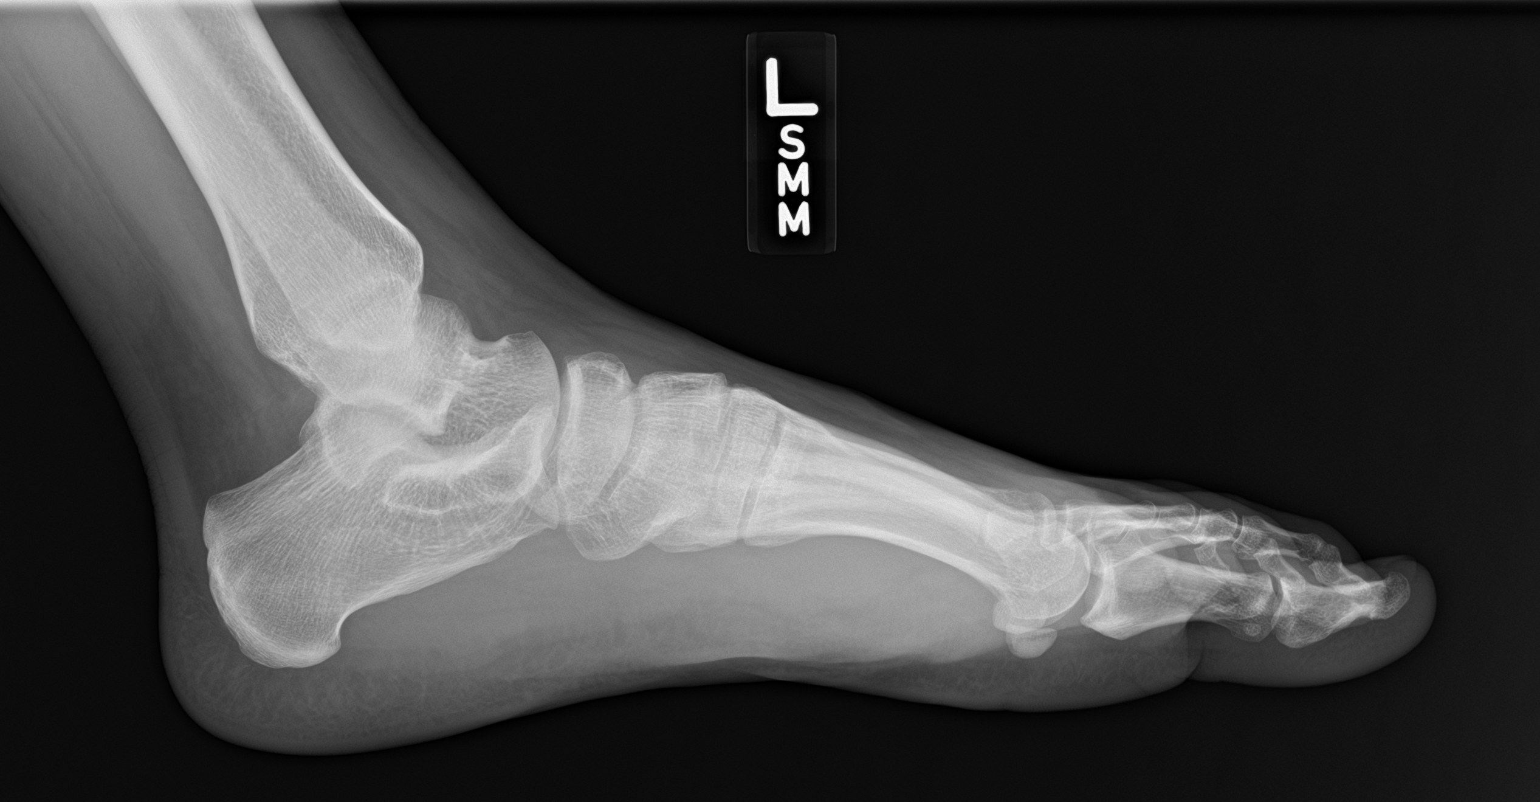

[3 of 3 positions shown; findings below may reference images not displayed]

FINDINGS: Possible tiny nondisplaced fracture involving the anterolateral
aspect of the cuboid, seen only on the AP view. No other fracture.
Normal alignment and joint spaces. There is soft tissue edema.
IMPRESSION: Possible tiny nondisplaced fracture involving the anterolateral
aspect of the cuboid.

## 2022-05-08 IMAGING — CR DG SHOULDER 2+V*L*
3 series · 3 of 3 positions shown · non-contrast
Comparison: None.

CLINICAL DATA: Left shoulder pain for 2 days, no known injury,
initial encounter

EXAM:
LEFT SHOULDER - 2+ VIEW

[shoulder grashey]
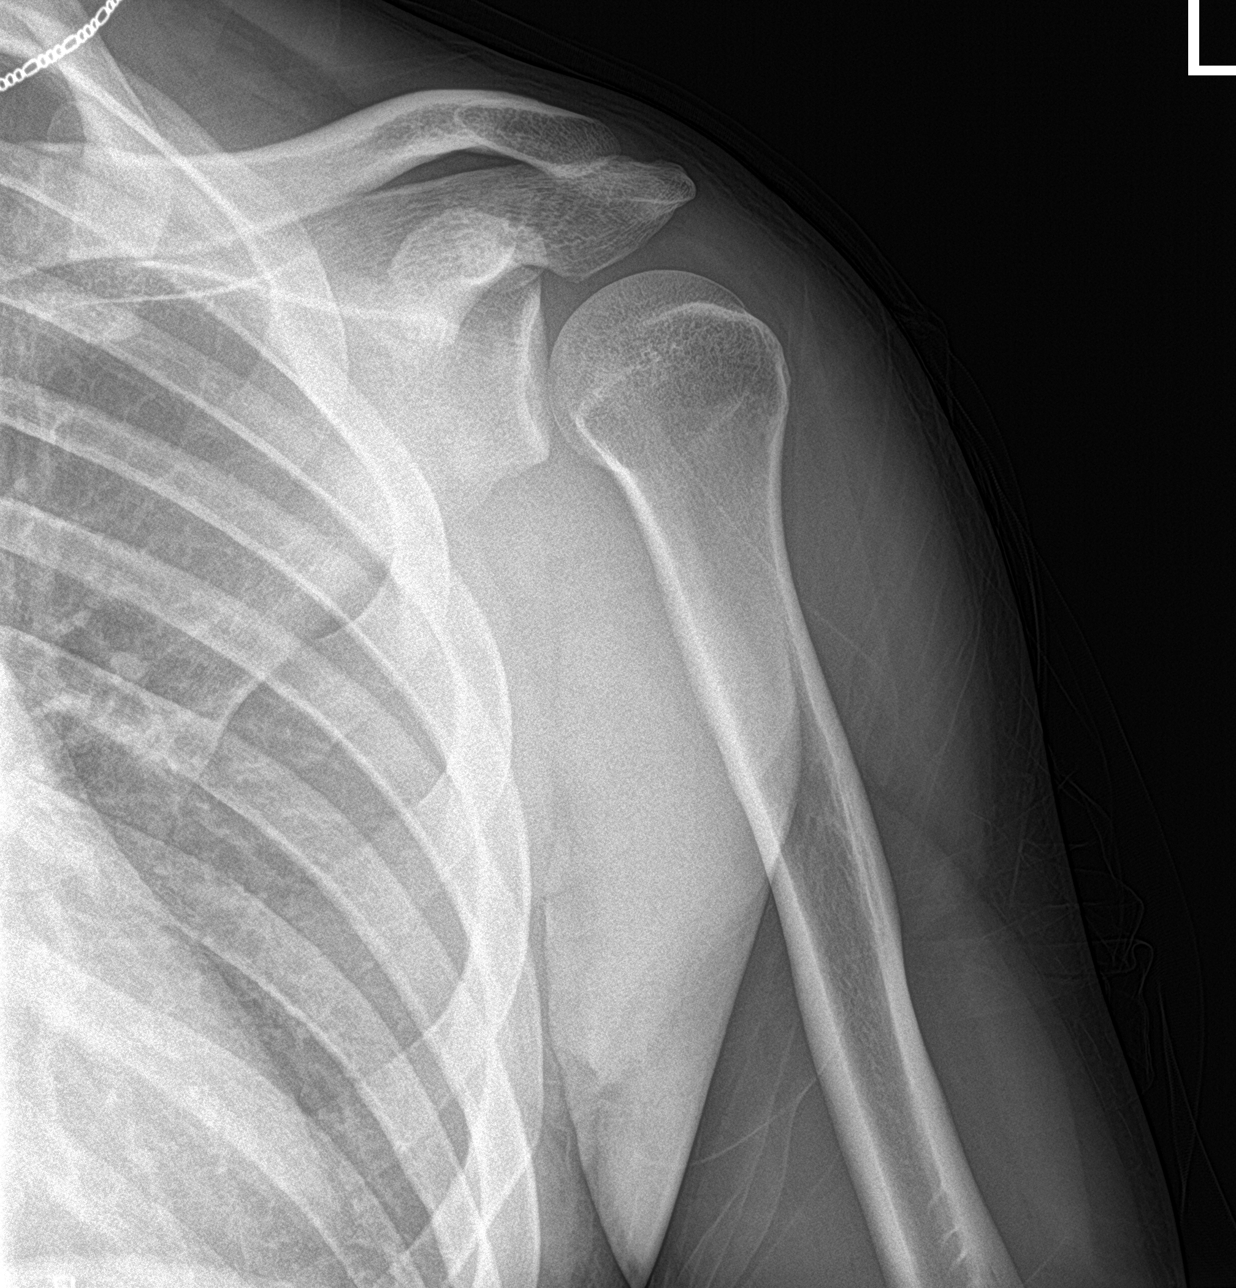

[shoulder y view]
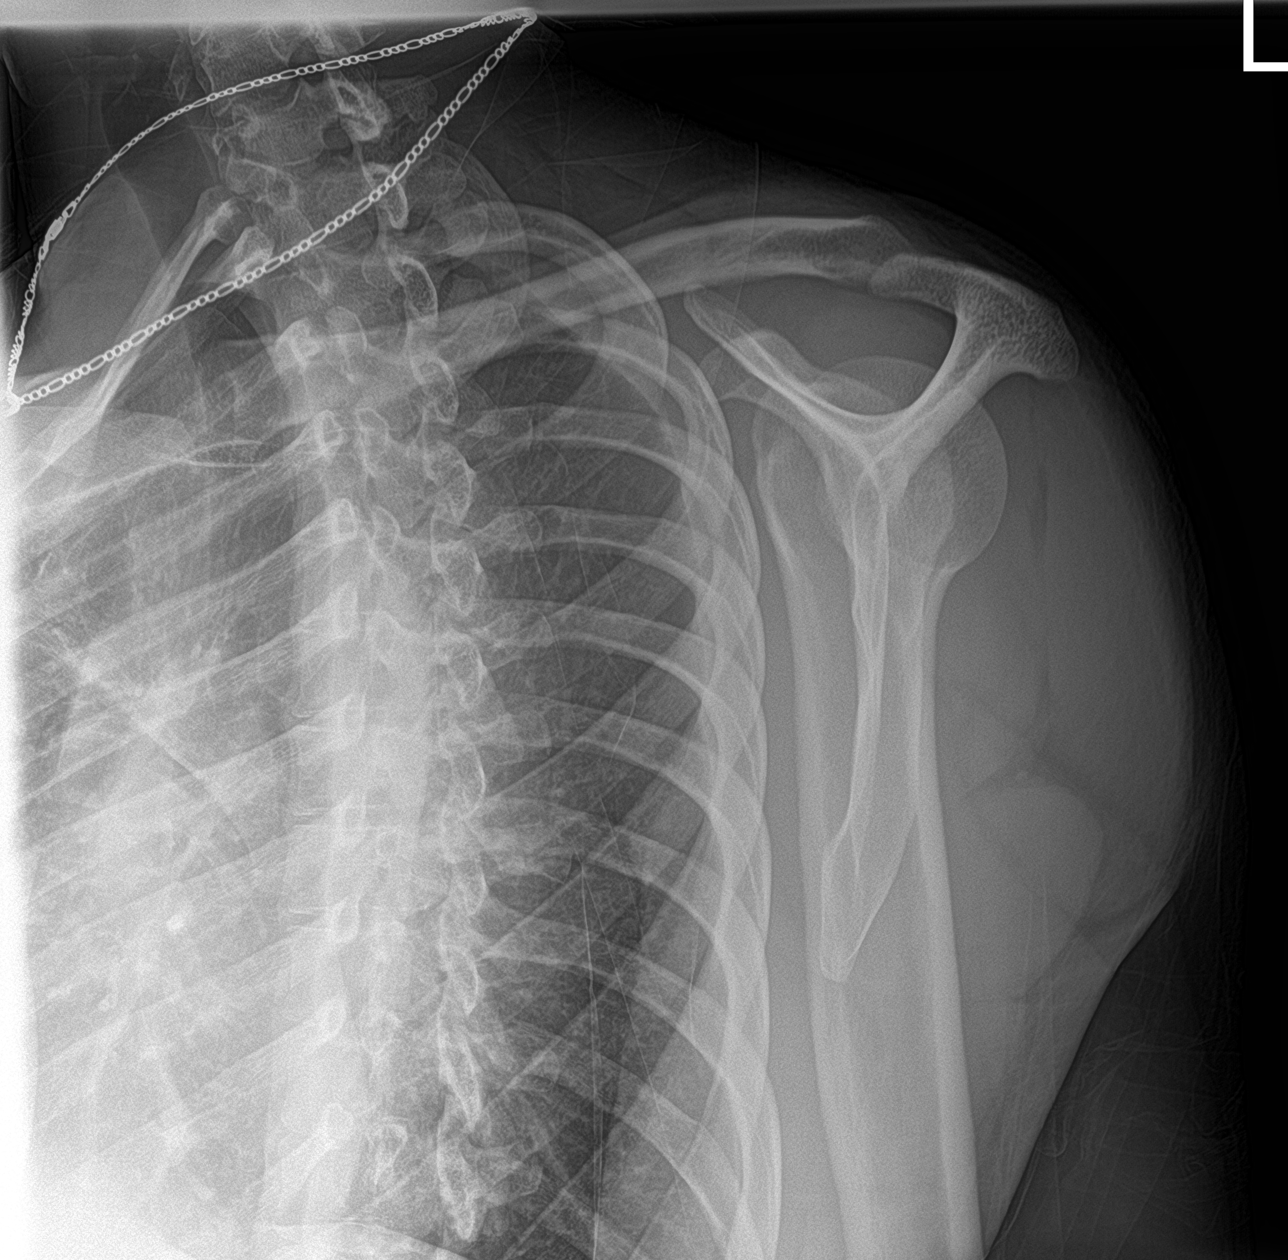

[shoulder axillary]
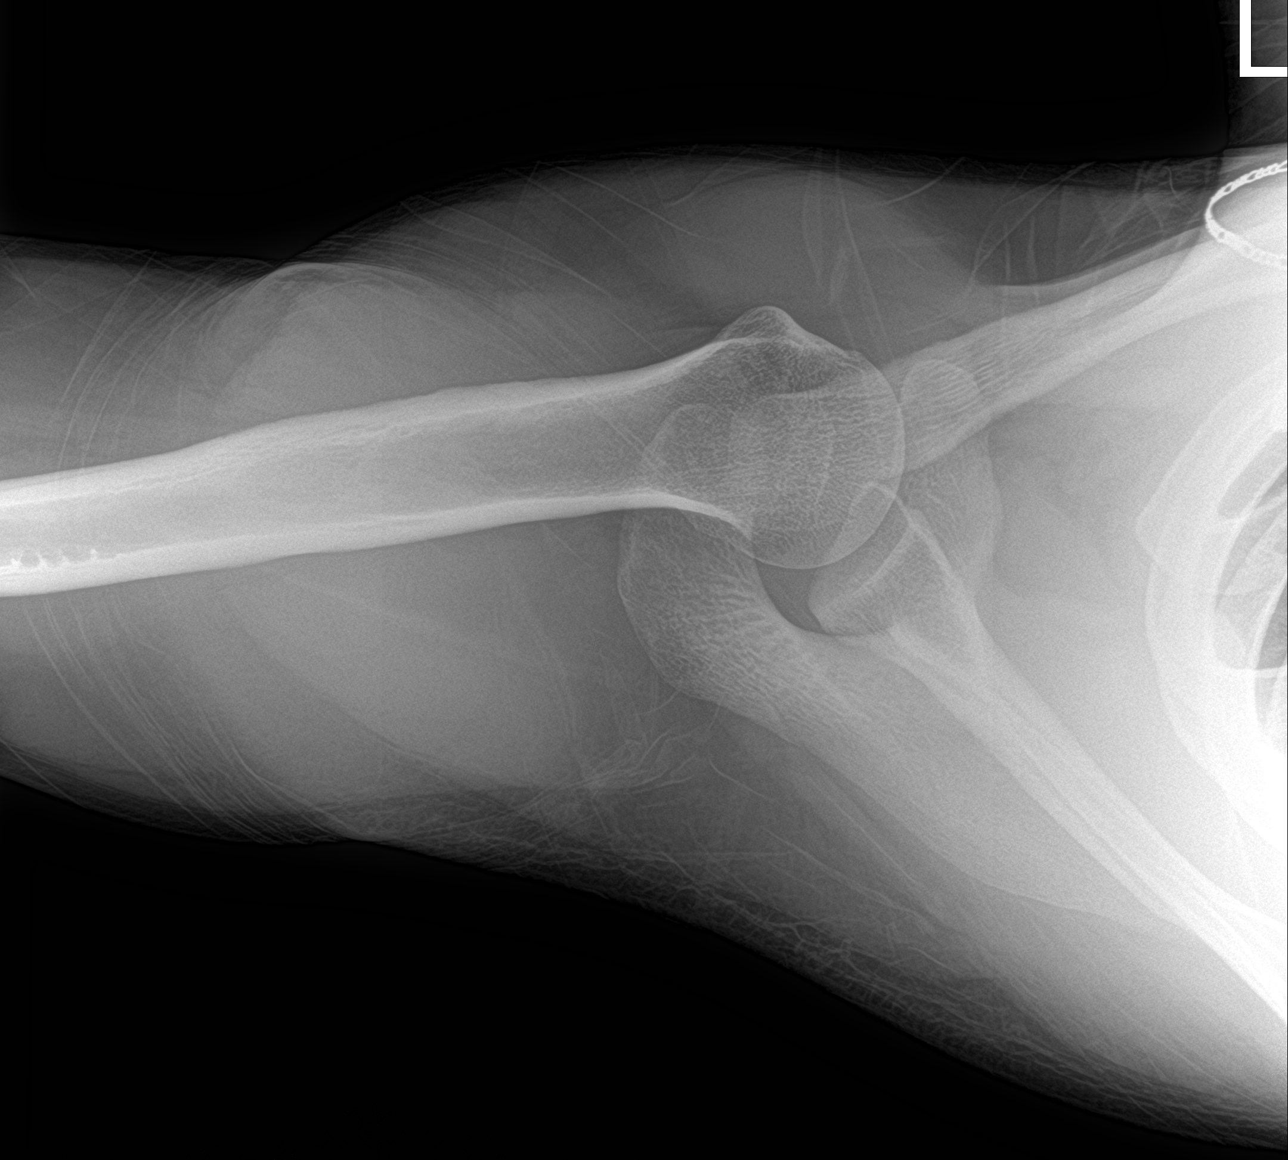

[3 of 3 positions shown; findings below may reference images not displayed]

FINDINGS: There is no evidence of fracture or dislocation. There is no
evidence of arthropathy or other focal bone abnormality. Soft
tissues are unremarkable.
IMPRESSION: No acute abnormality noted.
